# Patient Record
Sex: Male | Born: 1955 | Race: White | Hispanic: No | Marital: Married | Smoking: Current every day smoker
Health system: Southern US, Community
[De-identification: ages and names within clinical notes are randomized; demographics above are authoritative.]

## PROBLEM LIST (undated history)

## (undated) DIAGNOSIS — J309 Allergic rhinitis, unspecified: Secondary | ICD-10-CM

## (undated) DIAGNOSIS — E7800 Pure hypercholesterolemia, unspecified: Secondary | ICD-10-CM

## (undated) DIAGNOSIS — M549 Dorsalgia, unspecified: Secondary | ICD-10-CM

## (undated) DIAGNOSIS — J449 Chronic obstructive pulmonary disease, unspecified: Secondary | ICD-10-CM

## (undated) DIAGNOSIS — F419 Anxiety disorder, unspecified: Secondary | ICD-10-CM

## (undated) DIAGNOSIS — M1990 Unspecified osteoarthritis, unspecified site: Secondary | ICD-10-CM

## (undated) DIAGNOSIS — B1920 Unspecified viral hepatitis C without hepatic coma: Secondary | ICD-10-CM

## (undated) HISTORY — DX: Dorsalgia, unspecified: M54.9

## (undated) HISTORY — DX: Unspecified viral hepatitis C without hepatic coma: B19.20

## (undated) HISTORY — PX: HERNIA REPAIR: SHX51

## (undated) HISTORY — DX: Chronic obstructive pulmonary disease, unspecified: J44.9

## (undated) HISTORY — PX: SINOSCOPY: SHX187

## (undated) HISTORY — PX: SHOULDER SURGERY: SHX246

## (undated) HISTORY — PX: TONSILLECTOMY: SUR1361

## (undated) HISTORY — PX: APPENDECTOMY: SHX54

## (undated) HISTORY — DX: Unspecified osteoarthritis, unspecified site: M19.90

## (undated) HISTORY — DX: Anxiety disorder, unspecified: F41.9

## (undated) HISTORY — DX: Pure hypercholesterolemia, unspecified: E78.00

## (undated) HISTORY — DX: Allergic rhinitis, unspecified: J30.9

---

## 2009-05-03 ENCOUNTER — Ambulatory Visit: Payer: Self-pay | Admitting: Diagnostic Radiology

## 2009-05-03 ENCOUNTER — Emergency Department (HOSPITAL_BASED_OUTPATIENT_CLINIC_OR_DEPARTMENT_OTHER): Admission: EM | Admit: 2009-05-03 | Discharge: 2009-05-03 | Payer: Self-pay | Admitting: Emergency Medicine

## 2010-05-14 IMAGING — CR DG CHEST 2V
2 series · 2 of 2 positions shown · non-contrast
Comparison: None

CLINICAL DATA: Shortness of breath

CHEST - 2 VIEW

[w chest pa]
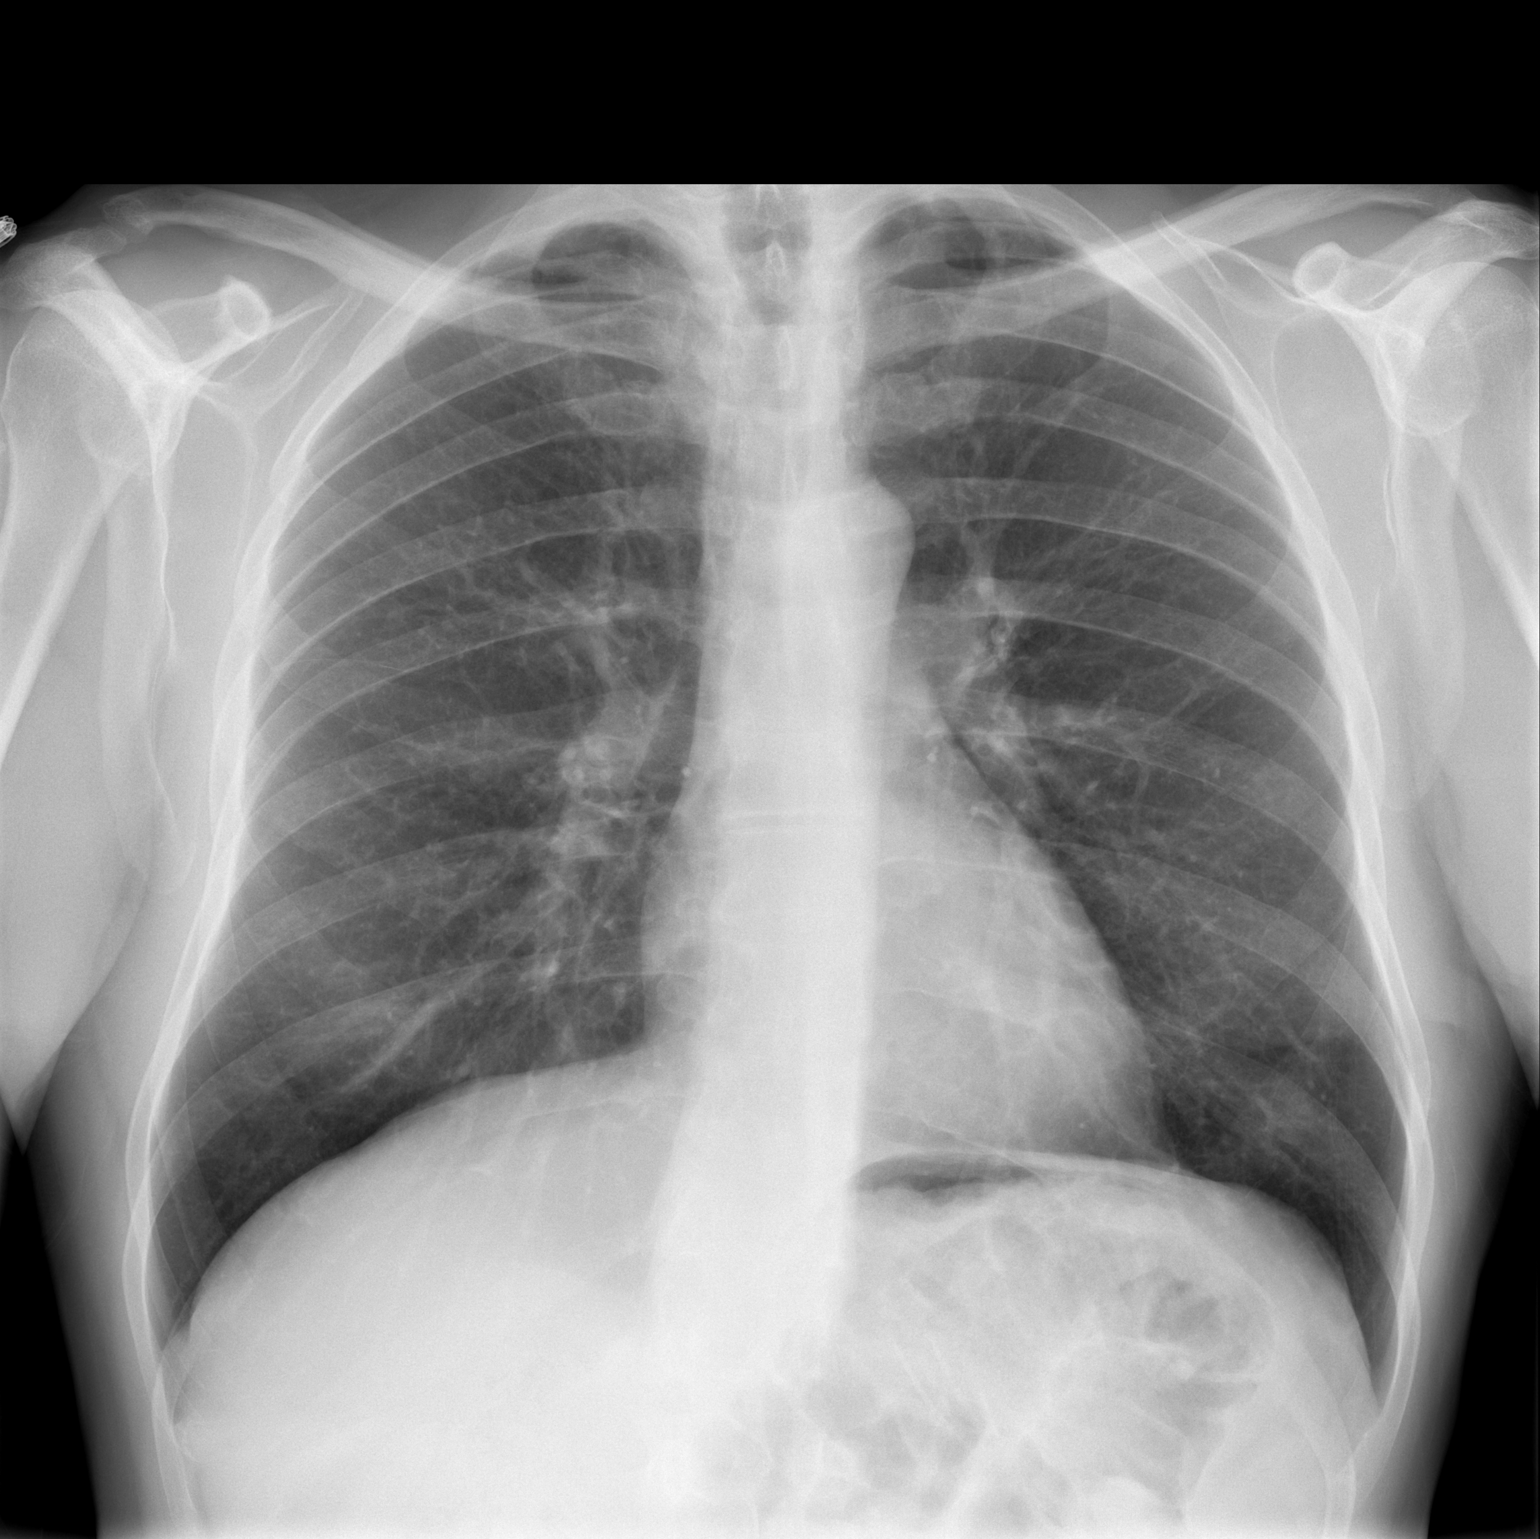

[w chest lat]
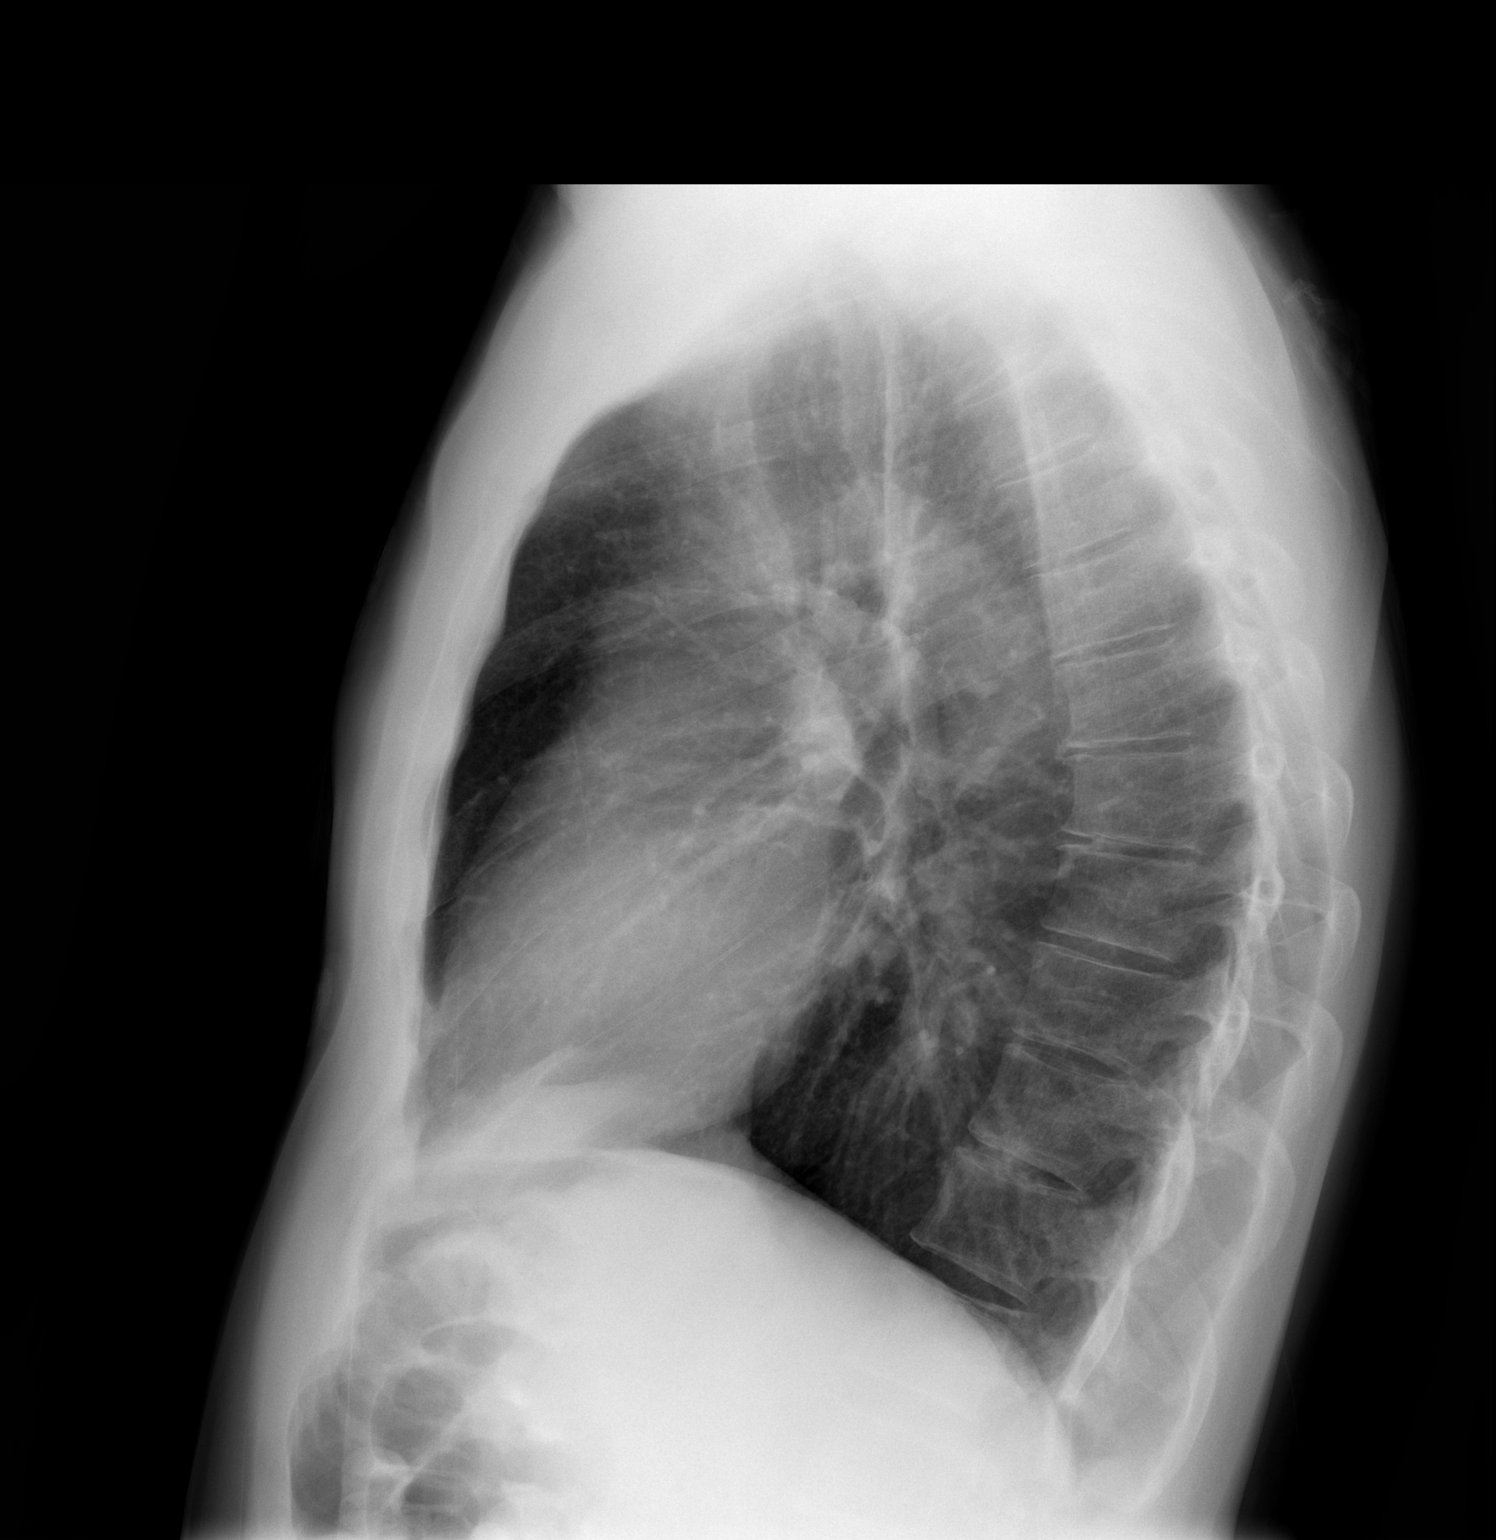

[2 of 2 positions shown; findings below may reference images not displayed]

FINDINGS: Heart size is normal.

There is no pleural effusion or pulmonary edema.

No airspace densities identified.
IMPRESSION: 1.  No active cardiopulmonary disease.

## 2011-03-30 LAB — DIFFERENTIAL
Basophils Absolute: 0 10*3/uL (ref 0.0–0.1)
Basophils Relative: 1 % (ref 0–1)
Eosinophils Absolute: 0.3 10*3/uL (ref 0.0–0.7)
Eosinophils Relative: 5 % (ref 0–5)
Monocytes Absolute: 0.5 10*3/uL (ref 0.1–1.0)
Monocytes Relative: 9 % (ref 3–12)
Neutro Abs: 2.9 10*3/uL (ref 1.7–7.7)

## 2011-03-30 LAB — CBC
HCT: 42.5 % (ref 39.0–52.0)
Hemoglobin: 14.5 g/dL (ref 13.0–17.0)
MCHC: 34.1 g/dL (ref 30.0–36.0)
MCV: 94 fL (ref 78.0–100.0)
RBC: 4.53 MIL/uL (ref 4.22–5.81)
RDW: 12.7 % (ref 11.5–15.5)

## 2011-03-30 LAB — BASIC METABOLIC PANEL
BUN: 13 mg/dL (ref 6–23)
Calcium: 9 mg/dL (ref 8.4–10.5)
Creatinine, Ser: 1.1 mg/dL (ref 0.4–1.5)
Potassium: 4 mEq/L (ref 3.5–5.1)

## 2011-03-30 LAB — POCT CARDIAC MARKERS: Myoglobin, poc: 46.5 ng/mL (ref 12–200)

## 2016-07-12 DIAGNOSIS — K4090 Unilateral inguinal hernia, without obstruction or gangrene, not specified as recurrent: Secondary | ICD-10-CM | POA: Insufficient documentation

## 2016-09-14 ENCOUNTER — Ambulatory Visit (INDEPENDENT_AMBULATORY_CARE_PROVIDER_SITE_OTHER): Payer: BLUE CROSS/BLUE SHIELD | Admitting: Allergy

## 2016-09-14 ENCOUNTER — Encounter: Payer: Self-pay | Admitting: Allergy

## 2016-09-14 VITALS — BP 132/80 | HR 80 | Temp 98.6°F | Resp 16 | Ht 68.5 in | Wt 149.0 lb

## 2016-09-14 DIAGNOSIS — J449 Chronic obstructive pulmonary disease, unspecified: Secondary | ICD-10-CM | POA: Diagnosis not present

## 2016-09-14 DIAGNOSIS — M1990 Unspecified osteoarthritis, unspecified site: Secondary | ICD-10-CM | POA: Insufficient documentation

## 2016-09-14 DIAGNOSIS — T7800XA Anaphylactic reaction due to unspecified food, initial encounter: Secondary | ICD-10-CM | POA: Diagnosis not present

## 2016-09-14 DIAGNOSIS — F419 Anxiety disorder, unspecified: Secondary | ICD-10-CM | POA: Insufficient documentation

## 2016-09-14 DIAGNOSIS — E7800 Pure hypercholesterolemia, unspecified: Secondary | ICD-10-CM | POA: Insufficient documentation

## 2016-09-14 NOTE — Progress Notes (Signed)
New Patient Note  RE: Donald SheerKeith Comas MRN: [id number] DOB: 1955/12/29 Date of Office Visit: 09/14/2016  Referring provider: Patrick Jupiterox, Erica A, NP Primary care provider: [facility], Burman FosterERICA A, NP  Chief Complaint: allergic reaction  History of present illness: Donald Hahn is a 60 y.o. male presenting today for consultation for allergic reaction.  He has had 2 reactions thus far with the most recent being 2 weeks ago.  He reports his first reaction he developed hives "all over" only.  He does not recall what he ate.  His second reaction he recalls eating beef hot dogs around 4:30am while on the job.  He started having a reaction around 10am.  He states he felt very flushed and developed hives he also reports tongue swelling and neck/jaw swelling.  He had an epipen at this time and wife injected him but did not note much improvement.  He went to ED for further treatment.  He was able to be discharged from the ED with additional steroids and benadryl.  He saw his PCP on 09/06/16 who ordered alpha gal testing (per pt results were positive) and he was referred to allergy.  He denies any nausea or vomiting as well as no cough/wheeze/SOB and denies any lightheadedness.  He denies having hives or swelling at any other time.  He does state he gets bitten by ticks a lot as he is outdoors frequently.    He reports he stays congested during early spring and early fall otherwise he reports no nasal or ocular symptoms at any other time.  He uses flonase during those times as well as pseudoephedrine.  He states he has not found an antihistamine that has been helpful.     He has COPD that is managed by his PCP and uses bevespi.  He also as albuterol rescue inhaler.  He reports his COPD is stable.  He is due for his annual flu vaccine.      Review of systems: Review of Systems  Constitutional: Positive for weight loss. Negative for chills and fever.  HENT: Positive for congestion. Negative for sore throat.   Eyes:  Negative for redness.  Respiratory: Negative for cough, shortness of breath and wheezing.   Cardiovascular: Negative for chest pain.  Gastrointestinal: Negative for nausea and vomiting.  Skin: Positive for itching and rash.  Neurological: Negative for headaches.    All other systems negative unless noted above in HPI  Past medical history: Past Medical History:  Diagnosis Date  . Allergic rhinitis   . Anxiety   . Arthritis   . Back pain   . COPD (chronic obstructive pulmonary disease) (HCC)   . Hepatitis-C   . High cholesterol    Past surgical history: Past Surgical History:  Procedure Laterality Date  . APPENDECTOMY    . HERNIA REPAIR    . SHOULDER SURGERY Right   . SINOSCOPY    . TONSILLECTOMY      Family history:  Family History: uknown  Problem Relation Age of Onset  . Adopted: Yes    Social history: Married.  Lives ina mobile home without carpeting.  Cats, dogs inside home.  Horses outside home.  He works as a Data processing managermaintenance mechanic.  Social History Main Topics  . Smoking status: Current Every Day Smoker  . Smokeless tobacco: Never Used   Medication List:   Medication List       Accurate as of 09/14/16  2:30 PM. Always use your most recent med list.  ARTHRITIS STRENGTH BC POWDER PO Take by mouth 2 (two) times daily.   atorvastatin 20 MG tablet Commonly known as:  LIPITOR   BEVESPI AEROSPHERE 9-4.8 MCG/ACT Aero Generic drug:  Glycopyrrolate-Formoterol   EPIPEN 2-PAK 0.3 mg/0.3 mL Soaj injection Generic drug:  EPINEPHrine   fluticasone 50 MCG/ACT nasal spray Commonly known as:  FLONASE   PROAIR HFA 108 (90 Base) MCG/ACT inhaler Generic drug:  albuterol   testosterone cypionate 200 MG/ML injection Commonly known as:  DEPOTESTOSTERONE CYPIONATE   Vitamin D (Ergocalciferol) 50000 units Caps capsule Commonly known as:  DRISDOL       Known medication allergies: Allergies  Allergen Reactions  . Penicillin G Itching     Physical  examination: Blood pressure 132/80, pulse 80, temperature 98.6 F (37 C), temperature source Oral, resp. rate 16, height 5' 8.5" (1.74 m), weight 149 lb (67.6 kg).  General: Alert, interactive, in no acute distress. HEENT: TMs pearly gray, turbinates mildly edematous without discharge, post-pharynx non erythematous. Neck: Supple without lymphadenopathy. Lungs: Clear to auscultation without wheezing, rhonchi or rales. {no increased work of breathing. CV: Normal S1, S2 without murmurs. Abdomen: Nondistended, nontender. Skin: Warm and dry, without lesions or rashes. Extremities:  No clubbing, cyanosis or edema. Neuro:   Grossly intact.  Diagnositics/Labs: Spirometry: FEV1: 2.39L  69%, FVC: 3.6L  78%, obstructive pattern  Assessment and plan:   Food allergy     - delayed allergic reaction following red meat ingestion is consistent with alpha-gal allergy     - testing done by your PCP per pt report is consistent with allergy -- will obtain lab results from PCP.  Pt provided with lab slip if additional labs need to be obtained not done by PCP    - at this time would recommend avoidance of red meats (beef, pork, lamb)    - have access to Epipen 0.3mg  at all times    - will plan to repeat alpha-gal labs in 1 year  COPD, stable    - continue use of bevespi    - continue albuterol use as needed     - recommend receiving flu vaccine this season  Allergic rhinitis   - continue flonase 1-2 spray each nostril as needed   - recommend use of Xyzal 5mg  daily (OTC long-acting antihistamine) as needed  Follow-up 1 year or sooner if needed   I appreciate the opportunity to take part in [place] care. Please do not hesitate to contact me with questions.  Sincerely,   Margo Aye, MD Allergy/Immunology [facility]

## 2016-09-14 NOTE — Patient Instructions (Addendum)
Food allergy     - delayed allergic reaction following red meat ingestion is consistent with alpha-gal allergy     - testing done by your PCP per your report is consistent with allergy    - at this time would recommend avoidance of red meats (beef, pork, lamb)    - have access to Epipen 0.3mg      - will plan to repeat alpha-gal labs in 1 year  COPD, stable    - continue use of bevespi    - continue albuterol use as needed     - recommend receiving flu vaccine this season  Allergic rhinitis   - continue flonase 1-2 spray each nostril as needed   - recommend use of Xyzal 5mg  daily (OTC long-acting antihistamine) as needed  Follow-up 1 year or sooner if needed

## 2016-09-15 ENCOUNTER — Telehealth: Payer: Self-pay | Admitting: *Deleted

## 2016-09-15 NOTE — Telephone Encounter (Signed)
Left message for Donald Hahn to call us back. Dr. Delorse LekPadgett reviewed his blood tests from his PCP. She confirmed that he does have Alpha Gal and should avoid all mammal meat. Also, his milk was positive but she told him he could keep it in his diet since it doesn't cause problems. Cat, dog, and cow dander were also all positive. Paper copy of labs are in basket up front.

## 2016-09-16 NOTE — Telephone Encounter (Signed)
Advised patient of lab results and avoidance

## 2016-10-06 ENCOUNTER — Encounter: Payer: Self-pay | Admitting: *Deleted

## 2021-05-10 DIAGNOSIS — R079 Chest pain, unspecified: Secondary | ICD-10-CM | POA: Diagnosis not present

## 2021-05-11 DIAGNOSIS — R079 Chest pain, unspecified: Secondary | ICD-10-CM | POA: Diagnosis not present

## 2021-05-11 DIAGNOSIS — R001 Bradycardia, unspecified: Secondary | ICD-10-CM | POA: Diagnosis not present

## 2021-11-14 DIAGNOSIS — I340 Nonrheumatic mitral (valve) insufficiency: Secondary | ICD-10-CM | POA: Diagnosis not present

## 2021-11-14 DIAGNOSIS — I361 Nonrheumatic tricuspid (valve) insufficiency: Secondary | ICD-10-CM

## 2021-12-18 DIAGNOSIS — I351 Nonrheumatic aortic (valve) insufficiency: Secondary | ICD-10-CM | POA: Diagnosis not present
